# Patient Record
Sex: Female | Born: 1965 | Race: Black or African American | Hispanic: No
Health system: Midwestern US, Academic
[De-identification: ages and names within clinical notes are randomized; demographics above are authoritative.]

---

## 2016-12-30 ENCOUNTER — Encounter: Admit: 2016-12-30 | Discharge: 2016-12-30

## 2016-12-30 NOTE — Telephone Encounter
Left message for call back for pre visit planning.

## 2017-01-05 ENCOUNTER — Encounter: Admit: 2017-01-05 | Discharge: 2017-01-05

## 2017-01-05 ENCOUNTER — Ambulatory Visit: Admit: 2017-01-05 | Discharge: 2017-01-06 | Payer: Private Health Insurance - Indemnity
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
## 2017-01-06 DIAGNOSIS — R413 Other amnesia: Principal | ICD-10-CM

## 2017-01-06 DIAGNOSIS — R42 Dizziness and giddiness: ICD-10-CM

## 2017-01-06 DIAGNOSIS — G43009 Migraine without aura, not intractable, without status migrainosus: ICD-10-CM

## 2017-01-06 DIAGNOSIS — G629 Polyneuropathy, unspecified: ICD-10-CM

## 2017-01-19 ENCOUNTER — Ambulatory Visit: Admit: 2017-01-19 | Discharge: 2017-01-19 | Payer: Private Health Insurance - Indemnity

## 2017-01-19 DIAGNOSIS — G43009 Migraine without aura, not intractable, without status migrainosus: Principal | ICD-10-CM

## 2017-01-19 DIAGNOSIS — R42 Dizziness and giddiness: Secondary | ICD-10-CM

## 2017-01-19 DIAGNOSIS — R413 Other amnesia: Secondary | ICD-10-CM

## 2017-01-27 ENCOUNTER — Encounter: Admit: 2017-01-27 | Discharge: 2017-01-27

## 2017-03-09 ENCOUNTER — Encounter: Admit: 2017-03-09 | Discharge: 2017-03-09

## 2017-03-09 DIAGNOSIS — M25571 Pain in right ankle and joints of right foot: Principal | ICD-10-CM

## 2017-04-06 ENCOUNTER — Encounter: Admit: 2017-04-06 | Discharge: 2017-04-06

## 2017-04-06 ENCOUNTER — Ambulatory Visit: Admit: 2017-04-06 | Discharge: 2017-04-07 | Payer: 59

## 2017-04-06 DIAGNOSIS — R42 Dizziness and giddiness: Secondary | ICD-10-CM

## 2017-04-07 DIAGNOSIS — F0781 Postconcussional syndrome: ICD-10-CM

## 2017-04-07 DIAGNOSIS — G43009 Migraine without aura, not intractable, without status migrainosus: Principal | ICD-10-CM

## 2017-04-07 DIAGNOSIS — R202 Paresthesia of skin: ICD-10-CM

## 2017-04-07 LAB — HEMOGLOBIN A1C: Lab: 5.5 %{Hb} (ref <5.7)

## 2017-04-07 LAB — ELECTROPHORESIS-SERUM PROTEIN
Lab: 0.3 g/dL (ref 0.2–0.5)
Lab: 0.4 g/dL (ref 0.4–0.6)
Lab: 6.8 g/dL (ref 6.1–8.1)

## 2017-04-07 LAB — VITAMIN B12: Lab: 127 pg/mL — ABNORMAL HIGH (ref 200–1100)

## 2017-04-07 LAB — TSH WITH FREE T4 REFLEX: Lab: 2.3 m[IU]/L (ref 0.2–0.3)

## 2017-04-12 ENCOUNTER — Encounter: Admit: 2017-04-12 | Discharge: 2017-04-12

## 2017-08-19 ENCOUNTER — Encounter: Admit: 2017-08-19 | Discharge: 2017-08-19
